# Patient Record
Sex: Female | Born: 1990 | Race: Black or African American | Hispanic: No | Marital: Single | Smoking: Never smoker
Health system: Southern US, Community
[De-identification: ages and names within clinical notes are randomized; demographics above are authoritative.]

## PROBLEM LIST (undated history)

## (undated) DIAGNOSIS — J302 Other seasonal allergic rhinitis: Secondary | ICD-10-CM

---
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
## 2015-03-04 ENCOUNTER — Encounter: Payer: Self-pay | Admitting: Emergency Medicine

## 2015-03-04 ENCOUNTER — Ambulatory Visit
Admission: EM | Admit: 2015-03-04 | Discharge: 2015-03-04 | Disposition: A | Discharge status: Home / Self Care | Payer: 59 | Attending: Family Medicine | Admitting: Family Medicine

## 2015-03-04 DIAGNOSIS — J0101 Acute recurrent maxillary sinusitis: Secondary | ICD-10-CM | POA: Insufficient documentation

## 2015-03-04 DIAGNOSIS — R51 Headache: Secondary | ICD-10-CM | POA: Diagnosis present

## 2015-03-04 LAB — RAPID STREP SCREEN (MED CTR MEBANE ONLY): Streptococcus, Group A Screen (Direct): NEGATIVE

## 2015-03-04 MED ORDER — AMOXICILLIN-POT CLAVULANATE 875-125 MG PO TABS: 1.0000 | ORAL_TABLET | Freq: Two times a day (BID) | ORAL | Status: AC

## 2015-03-04 MED ORDER — FLUTICASONE PROPIONATE 50 MCG/ACT NA SUSP: 2.0000 | Freq: Two times a day (BID) | NASAL | Status: AC

## 2015-03-04 MED ORDER — ACETAMINOPHEN 325 MG PO TABS: 650.0000 mg | ORAL_TABLET | Freq: Once | ORAL | Status: DC

## 2015-03-04 MED ORDER — PREDNISONE 10 MG PO TABS: ORAL_TABLET | ORAL | Status: AC

## 2015-03-04 NOTE — ED Notes (Signed)
Pt reports sinus pressure and drainage that started Tuesday. Symptoms also include sore throat and fever

## 2015-03-04 NOTE — Discharge Instructions (Signed)

## 2015-03-04 NOTE — ED Provider Notes (Signed)
CSN: [id number]     Arrival date & time 03/04/15  [id number] History   None    Chief Complaint  Patient presents with  . Facial Pain   (Consider location/radiation/quality/duration/timing/severity/associated sxs/prior Treatment) HPI     [age] year old female presents complaining of possible sinusitis. She has right-sided maxillary and frontal sinus pressure, drainage, rhinorrhea, nasal congestion, sore throat, fever, chills, and general fatigue. This started 3 days ago and has gotten gradually worse. She has seasonal allergies, she occasionally takes Claritin for this. No recent travel or sick contacts. She has a history of recurrent sinusitis.  No past medical history on file. No past surgical history on file. No family history on file. History  Substance Use Topics  . Smoking status: Never Smoker   . Smokeless tobacco: Not on file  . Alcohol Use: Yes     Comment: occassional   OB History    No data available     Review of Systems  Constitutional: Positive for fever and chills.  HENT: Positive for congestion, rhinorrhea and sinus pressure. Negative for ear pain.   Respiratory: Positive for cough. Negative for shortness of breath.   Cardiovascular: Negative for chest pain.  Gastrointestinal: Negative for nausea, vomiting, abdominal pain and diarrhea.  All other systems reviewed and are negative.   Allergies  Review of patient's allergies indicates no known allergies.  Home Medications   Prior to Admission medications   Medication Sig Start Date End Date Taking? Authorizing Provider  norethindrone-ethinyl estradiol (MICROGESTIN,JUNEL,LOESTRIN) 1-20 MG-MCG tablet Take 1 tablet by mouth daily.   Yes Historical Provider, MD  amoxicillin-clavulanate (AUGMENTIN) 875-125 MG per tablet Take 1 tablet by mouth every 12 (twelve) hours. 03/04/15   Adrian BlackwaterZachary H Liv Rallis, PA-C  fluticasone (FLONASE) 50 MCG/ACT nasal spray Place 2 sprays into both nostrils 2 (two) times daily. Decrease to 2 sprays/nostril  daily after 5 days 03/04/15   Graylon GoodZachary H Jacalynn Buzzell, PA-C  predniSONE (DELTASONE) 10 MG tablet 4 tabs PO QD for 4 days; 3 tabs PO QD for 3 days; 2 tabs PO QD for 2 days; 1 tab PO QD for 1 day 03/04/15   Graylon GoodZachary H Brooksie Ellwanger, PA-C   BP 122/80 mmHg  Pulse 102  Temp(Src) 100.2 F (37.9 C) (Oral)  Resp 14  Ht 5' 2.5" (1.588 m)  Wt 138 lb (62.596 kg)  BMI 24.82 kg/m2  SpO2 100% Physical Exam  Constitutional: She is oriented to person, place, and time. Vital signs are normal. She appears well-developed and well-nourished. No distress.  HENT:  Head: Normocephalic and atraumatic.  Right Ear: External ear normal.  Left Ear: External ear normal.  Nose: Right sinus exhibits maxillary sinus tenderness and frontal sinus tenderness. Left sinus exhibits frontal sinus tenderness. Left sinus exhibits no maxillary sinus tenderness.  Mouth/Throat: Posterior oropharyngeal erythema present. No oropharyngeal exudate.  Cobblestoning of the posterior oropharynx  Eyes: Conjunctivae are normal.  Neck: Normal range of motion. Neck supple.  Cardiovascular: Normal rate, regular rhythm and normal heart sounds.   Pulmonary/Chest: Effort normal and breath sounds normal. No respiratory distress.  Lymphadenopathy:    She has no cervical adenopathy.  Neurological: She is alert and oriented to person, place, and time. She has normal strength. Coordination normal.  Skin: Skin is warm and dry. No rash noted. She is not diaphoretic.  Psychiatric: She has a normal mood and affect. Judgment normal.  Nursing note and vitals reviewed.   ED Course  Procedures (including critical care time) Labs Review Labs Reviewed  RAPID STREP SCREEN (  NOT AT [facility])    Imaging Review No results found.   MDM   1. Acute recurrent maxillary sinusitis   treat with Augmentin as well as symptomatic management. Follow-up if no improvement in a few days  New Prescriptions   AMOXICILLIN-CLAVULANATE (AUGMENTIN) 875-125 MG PER TABLET    Take 1 tablet  by mouth every 12 (twelve) hours.   FLUTICASONE (FLONASE) 50 MCG/ACT NASAL SPRAY    Place 2 sprays into both nostrils 2 (two) times daily. Decrease to 2 sprays/nostril daily after 5 days   PREDNISONE (DELTASONE) 10 MG TABLET    4 tabs PO QD for 4 days; 3 tabs PO QD for 3 days; 2 tabs PO QD for 2 days; 1 tab PO QD for 1 day       Graylon Good, PA-C 03/04/15 [phone]

## 2015-03-07 LAB — CULTURE, GROUP A STREP (THRC)

## 2015-04-20 ENCOUNTER — Ambulatory Visit
Admission: EM | Admit: 2015-04-20 | Discharge: 2015-04-20 | Disposition: A | Discharge status: Home / Self Care | Payer: 59 | Attending: Family Medicine | Admitting: Family Medicine

## 2015-04-20 DIAGNOSIS — L03317 Cellulitis of buttock: Secondary | ICD-10-CM

## 2015-04-20 HISTORY — DX: Other seasonal allergic rhinitis: J30.2

## 2015-04-20 MED ORDER — SULFAMETHOXAZOLE-TRIMETHOPRIM 800-160 MG PO TABS: 1.0000 | ORAL_TABLET | Freq: Two times a day (BID) | ORAL | Status: AC

## 2015-04-20 NOTE — ED Provider Notes (Signed)
CSN: [id number]     Arrival date & time 04/20/15  1739 History   First MD Initiated Contact with Patient 04/20/15 1824     Chief Complaint  Patient presents with  . Skin Problem   (Consider location/radiation/quality/duration/timing/severity/associated sxs/prior Treatment) HPI Comments: 24 yo female with a 4-5 days h/o right buttock skin lesion, redness and pain. States 5 days ago felt a sudden sharp pain to the area but did not know what it was. Thinks may have been bitten by an insect. Denies any drainage, fevers, chills.   The history is provided by the patient.    Past Medical History  Diagnosis Date  . Seasonal allergies    Past Surgical History  Procedure Laterality Date  . Tonsillectomy  2007   No family history on file. History  Substance Use Topics  . Smoking status: Never Smoker   . Smokeless tobacco: Never Used  . Alcohol Use: Yes     Comment: occassional   OB History    Gravida Para Term Preterm AB TAB SAB Ectopic Multiple Living   0 0 0 0 0 0 0 0 0 0      Review of Systems  Allergies  Review of patient's allergies indicates no known allergies.  Home Medications   Prior to Admission medications   Medication Sig Start Date End Date Taking? Authorizing Provider  loratadine-pseudoephedrine (CLARITIN-D 24-HOUR) 10-240 MG per 24 hr tablet Take 1 tablet by mouth daily.   Yes Historical Provider, MD  amoxicillin-clavulanate (AUGMENTIN) 875-125 MG per tablet Take 1 tablet by mouth every 12 (twelve) hours. 03/04/15   Adrian BlackwaterZachary H Baker, PA-C  fluticasone (FLONASE) 50 MCG/ACT nasal spray Place 2 sprays into both nostrils 2 (two) times daily. Decrease to 2 sprays/nostril daily after 5 days 03/04/15   Graylon GoodZachary H Baker, PA-C  norethindrone-ethinyl estradiol (MICROGESTIN,JUNEL,LOESTRIN) 1-20 MG-MCG tablet Take 1 tablet by mouth daily.    Historical Provider, MD  predniSONE (DELTASONE) 10 MG tablet 4 tabs PO QD for 4 days; 3 tabs PO QD for 3 days; 2 tabs PO QD for 2 days; 1 tab  PO QD for 1 day 03/04/15   Graylon GoodZachary H Baker, PA-C  sulfamethoxazole-trimethoprim (BACTRIM DS,SEPTRA DS) 800-160 MG per tablet Take 1 tablet by mouth 2 (two) times daily. 04/20/15   Payton Mccallumrlando Shandell Jallow, MD   BP 122/87 mmHg  Pulse 72  Temp(Src) 99.1 F (37.3 C) (Oral)  Resp 16  Ht 5\' 2"  (1.575 m)  Wt 137 lb (62.143 kg)  BMI 25.05 kg/m2  SpO2 100%  LMP 04/10/2015 (Exact Date) Physical Exam  Constitutional: She appears well-developed and well-nourished. No distress.  Neurological: She is alert.  Skin: She is not diaphoretic. There is erythema.  Area approx 4x5cm of skin blanchable erythema, warmth and tenderness to palpation with pinpoint ulceration (puncture?) on the right buttock; no fluctuance or drainage  Nursing note and vitals reviewed.   ED Course  Procedures (including critical care time) Labs Review Labs Reviewed - No data to display  Imaging Review No results found.   MDM   1. Cellulitis of buttock, right    New Prescriptions   SULFAMETHOXAZOLE-TRIMETHOPRIM (BACTRIM DS,SEPTRA DS) 800-160 MG PER TABLET    Take 1 tablet by mouth 2 (two) times daily.  Plan: 1.  diagnosis reviewed with patient 2. rx as per orders; risks, benefits, potential side effects reviewed with patient 3. Recommend supportive treatment with warm compresses to area 4. F/u prn if symptoms worsen or don't improve    Payton Mccallumrlando Ranelle Auker, MD  04/20/15 1839 

## 2015-04-20 NOTE — ED Notes (Signed)
Possible bug bite x 5 days affecting the right buttock. Pt reports that there is a wound that is painful, red, swollen and warm to the touch.

## 2015-04-22 ENCOUNTER — Ambulatory Visit
Admission: EM | Admit: 2015-04-22 | Discharge: 2015-04-22 | Disposition: A | Discharge status: Home / Self Care | Payer: 59 | Attending: Internal Medicine | Admitting: Internal Medicine

## 2015-04-22 DIAGNOSIS — L0231 Cutaneous abscess of buttock: Secondary | ICD-10-CM | POA: Diagnosis not present

## 2015-04-22 NOTE — Discharge Instructions (Signed)
Finish trimethoprim/sulfamethoxazole. Continue applying warm compresses to abscess, soaking. Wash site with soap/water 1-2 times daily, apply antibiotic ointment and bandage after. Recheck in 3-4 days to check progress. Note for work given, RTW 04/25/15.

## 2015-04-22 NOTE — ED Notes (Signed)
Possible bug bite last Thursday around 9pm.  Was seen in this clinic on Tuesday, was told to put hot compress on it, now pus and blood coming out of the bite.  Location of the bite is right buttock.  Patient on antibiotics.

## 2015-04-22 NOTE — ED Provider Notes (Signed)
CSN: [id number]     Arrival date & time 04/22/15  1015 History   First MD Initiated Contact with Patient 04/22/15 1154     Chief Complaint  Patient presents with  . Insect Bite   HPI   24 year old seen 2 days ago with early abscess right buttock; she has been soaking and applying warm compresses, and spontaneous drainage from the site began yesterday. She is worried and fearful about possible incision and drainage; she is also concerned that the lesion hasn't healed yet. No fever, no malaise.  Past Medical History  Diagnosis Date  . Seasonal allergies    Past Surgical History  Procedure Laterality Date  . Tonsillectomy  2007   Family History  Problem Relation Age of Onset  . Hypertension Father    History  Substance Use Topics  . Smoking status: Never Smoker   . Smokeless tobacco: Never Used  . Alcohol Use: Yes     Comment: occassional   OB History    Gravida Para Term Preterm AB TAB SAB Ectopic Multiple Living   0 0 0 0 0 0 0 0 0 0      Review of Systems  All other systems reviewed and are negative.   Allergies  Review of patient's allergies indicates no known allergies.  Home Medications   Prior to Admission medications   Medication Sig Start Date End Date Taking? Authorizing Provider         loratadine-pseudoephedrine (CLARITIN-D 24-HOUR) 10-240 MG per 24 hr tablet Take 1 tablet by mouth daily.   Yes Historical Provider, MD  norethindrone-ethinyl estradiol (MICROGESTIN,JUNEL,LOESTRIN) 1-20 MG-MCG tablet Take 1 tablet by mouth daily.   Yes Historical Provider, MD  sulfamethoxazole-trimethoprim (BACTRIM DS,SEPTRA DS) 800-160 MG per tablet Take 1 tablet by mouth 2 (two) times daily. 04/20/15  Yes Payton Mccallumrlando Conty, MD                 BP 134/96 mmHg  Pulse 97  Temp(Src) 97 F (36.1 C) (Tympanic)  Resp 16  Ht 5\' 2"  (1.575 m)  Wt 137 lb (62.143 kg)  BMI 25.05 kg/m2  SpO2 100%  LMP 04/10/2015 Physical Exam  Constitutional: She is oriented to person, place, and  time.  Alert, nicely groomed Crying, very afraid about possible I&D  HENT:  Head: Atraumatic.  Eyes:  Conjugate gaze, no eye redness/drainage  Neck: Neck supple.  Cardiovascular: Normal rate.   Pulmonary/Chest: No respiratory distress.  Abdominal: She exhibits no distension.  Musculoskeletal: Normal range of motion.  No leg swelling  Neurological: She is alert and oriented to person, place, and time.  Skin: Skin is warm and dry.  No cyanosis Right buttock with a 1.5 inch area of induration/erythema, central opening, easily expressible purulent drainage, tender.  Nursing note and vitals reviewed.   ED Course  Procedures no procedure at the urgent care today  MDM   1. Abscess of buttock, right    Continue soaking, warm compresses. Finish antibiotic. Wash wound daily soap/water, apply antibiotic ointment and dry dressing. Recheck in 3 or 4 days if not improving.    Eustace MooreLaura W Lorri Fukuhara, MD 04/22/15 2055

## 2015-10-30 ENCOUNTER — Ambulatory Visit
Admission: EM | Admit: 2015-10-30 | Discharge: 2015-10-30 | Disposition: A | Discharge status: Other Acute Inpatient Hospital | Payer: 59 | Attending: Family Medicine | Admitting: Family Medicine

## 2015-10-30 ENCOUNTER — Emergency Department: Payer: 59

## 2015-10-30 ENCOUNTER — Encounter: Payer: Self-pay | Admitting: Emergency Medicine

## 2015-10-30 ENCOUNTER — Emergency Department
Admission: EM | Admit: 2015-10-30 | Discharge: 2015-10-31 | Disposition: A | Discharge status: Home / Self Care | Payer: 59 | Attending: Emergency Medicine | Admitting: Emergency Medicine

## 2015-10-30 ENCOUNTER — Encounter: Payer: Self-pay | Admitting: Gynecology

## 2015-10-30 DIAGNOSIS — Z7951 Long term (current) use of inhaled steroids: Secondary | ICD-10-CM | POA: Insufficient documentation

## 2015-10-30 DIAGNOSIS — Z79899 Other long term (current) drug therapy: Secondary | ICD-10-CM | POA: Insufficient documentation

## 2015-10-30 DIAGNOSIS — Z3202 Encounter for pregnancy test, result negative: Secondary | ICD-10-CM | POA: Insufficient documentation

## 2015-10-30 DIAGNOSIS — R197 Diarrhea, unspecified: Secondary | ICD-10-CM | POA: Insufficient documentation

## 2015-10-30 DIAGNOSIS — R509 Fever, unspecified: Secondary | ICD-10-CM | POA: Diagnosis not present

## 2015-10-30 DIAGNOSIS — R1031 Right lower quadrant pain: Secondary | ICD-10-CM | POA: Diagnosis present

## 2015-10-30 DIAGNOSIS — Z792 Long term (current) use of antibiotics: Secondary | ICD-10-CM | POA: Insufficient documentation

## 2015-10-30 LAB — COMPREHENSIVE METABOLIC PANEL
ALK PHOS: 53 U/L (ref 38–126)
ALT: 21 U/L (ref 14–54)
AST: 33 U/L (ref 15–41)
Albumin: 4.3 g/dL (ref 3.5–5.0)
Anion gap: 8 (ref 5–15)
BUN: 8 mg/dL (ref 6–20)
CALCIUM: 9.5 mg/dL (ref 8.9–10.3)
CO2: 25 mmol/L (ref 22–32)
Chloride: 104 mmol/L (ref 101–111)
Creatinine, Ser: 0.82 mg/dL (ref 0.44–1.00)
Glucose, Bld: 108 mg/dL — ABNORMAL HIGH (ref 65–99)
Potassium: 3.8 mmol/L (ref 3.5–5.1)
Sodium: 137 mmol/L (ref 135–145)
Total Bilirubin: 1 mg/dL (ref 0.3–1.2)
Total Protein: 7.6 g/dL (ref 6.5–8.1)

## 2015-10-30 LAB — WET PREP, GENITAL
Clue Cells Wet Prep HPF POC: NONE SEEN
Sperm: NONE SEEN
Trich, Wet Prep: NONE SEEN
Yeast Wet Prep HPF POC: NONE SEEN

## 2015-10-30 LAB — URINALYSIS COMPLETE WITH MICROSCOPIC (ARMC ONLY)
BACTERIA UA: NONE SEEN
Bilirubin Urine: NEGATIVE
Glucose, UA: NEGATIVE mg/dL
Ketones, ur: NEGATIVE mg/dL
LEUKOCYTES UA: NEGATIVE
Nitrite: NEGATIVE
PROTEIN: NEGATIVE mg/dL
Specific Gravity, Urine: 1.017 (ref 1.005–1.030)
pH: 6 (ref 5.0–8.0)

## 2015-10-30 LAB — CBC WITH DIFFERENTIAL/PLATELET
Basophils Absolute: 0 10*3/uL (ref 0–0.1)
Basophils Relative: 0 %
EOS PCT: 0 %
Eosinophils Absolute: 0 10*3/uL (ref 0–0.7)
HCT: 40 % (ref 35.0–47.0)
HEMOGLOBIN: 13.2 g/dL (ref 12.0–16.0)
LYMPHS ABS: 0.5 10*3/uL — AB (ref 1.0–3.6)
Lymphocytes Relative: 3 %
MCH: 27.7 pg (ref 26.0–34.0)
MCHC: 33.1 g/dL (ref 32.0–36.0)
MCV: 83.7 fL (ref 80.0–100.0)
Monocytes Absolute: 0.6 10*3/uL (ref 0.2–0.9)
Monocytes Relative: 4 %
NEUTROS PCT: 93 %
Neutro Abs: 13.1 10*3/uL — ABNORMAL HIGH (ref 1.4–6.5)
Platelets: 172 10*3/uL (ref 150–440)
RBC: 4.78 MIL/uL (ref 3.80–5.20)
RDW: 14 % (ref 11.5–14.5)
WBC: 14.2 10*3/uL — AB (ref 3.6–11.0)

## 2015-10-30 LAB — POCT PREGNANCY, URINE: PREG TEST UR: NEGATIVE

## 2015-10-30 LAB — LIPASE, BLOOD: Lipase: 17 U/L (ref 11–51)

## 2015-10-30 MED ORDER — IOHEXOL 300 MG/ML  SOLN
100.0000 mL | Freq: Once | INTRAMUSCULAR | Status: AC | PRN
  Administered 2015-10-30: 100 mL via INTRAVENOUS

## 2015-10-30 MED ORDER — LORAZEPAM 0.5 MG PO TABS
0.5000 mg | ORAL_TABLET | Freq: Once | ORAL | Status: AC
  Administered 2015-10-30: 0.5 mg via ORAL
  Filled 2015-10-30: qty 1

## 2015-10-30 MED ORDER — IOHEXOL 240 MG/ML SOLN
25.0000 mL | Freq: Once | INTRAMUSCULAR | Status: AC | PRN
  Administered 2015-10-30: 25 mL via ORAL

## 2015-10-30 NOTE — ED Notes (Signed)
Patient transported to Ultrasound 

## 2015-10-30 NOTE — ED Provider Notes (Signed)
CSN: [id number]     Arrival date & time 10/30/15  1422 History   First MD Initiated Contact with Patient 10/30/15 1451     Chief Complaint  Patient presents with  . Abdominal Pain   (Consider location/radiation/quality/duration/timing/severity/associated sxs/prior Treatment) HPI: Patient presents today with symptoms of generalized abdominal pain and diarrhea. Patient has a low-grade fever in the office today. She has noticed a pink color to her diarrhea however she has been eating grapefruit so she is unsure whether that has caused the discoloration. She normally has symptoms of constipation and has used mag citrate, fiber, grapefruit in the past for this. She denies any history of inflammatory bowel disease or diverticulitis. She still has her appendix. She denies having any vomiting.  Past Medical History  Diagnosis Date  . Seasonal allergies    Past Surgical History  Procedure Laterality Date  . Tonsillectomy  2007   Family History  Problem Relation Age of Onset  . Hypertension Father    Social History  Substance Use Topics  . Smoking status: Never Smoker   . Smokeless tobacco: Never Used  . Alcohol Use: Yes     Comment: occassional   OB History    Gravida Para Term Preterm AB TAB SAB Ectopic Multiple Living       Review of Systems: Negative except mentioned above.  Allergies  Review of patient's allergies indicates no known allergies.  Home Medications   Prior to Admission medications   Medication Sig Start Date End Date Taking? Authorizing Provider  MELATONIN ER PO Take by mouth.   Yes Historical Provider, MD  norethindrone-ethinyl estradiol (MICROGESTIN,JUNEL,LOESTRIN) 1-20 MG-MCG tablet Take 1 tablet by mouth daily.   Yes Historical Provider, MD  amoxicillin-clavulanate (AUGMENTIN) 875-125 MG per tablet Take 1 tablet by mouth every 12 (twelve) hours. 03/04/15   Adrian Blackwater Baker, PA-C  fluticasone (FLONASE) 50 MCG/ACT nasal spray Place 2 sprays  into both nostrils 2 (two) times daily. Decrease to 2 sprays/nostril daily after 5 days 03/04/15   Graylon Good, PA-C  loratadine-pseudoephedrine (CLARITIN-D 24-HOUR) 10-240 MG per 24 hr tablet Take 1 tablet by mouth daily.    Historical Provider, MD  predniSONE (DELTASONE) 10 MG tablet 4 tabs PO QD for 4 days; 3 tabs PO QD for 3 days; 2 tabs PO QD for 2 days; 1 tab PO QD for 1 day 03/04/15   Graylon Good, PA-C  sulfamethoxazole-trimethoprim (BACTRIM DS,SEPTRA DS) 800-160 MG per tablet Take 1 tablet by mouth 2 (two) times daily. 04/20/15   Payton Mccallum, MD   Meds Ordered and Administered this Visit  Medications - No data to display  BP 118/58 mmHg  Pulse 104  Temp(Src) 99.3 F (37.4 C) (Oral)  Resp 16  Ht  (1.575 m)  Wt 136 lb (61.689 kg)  BMI 24.87 kg/m2  SpO2 100%  LMP 10/26/2015 No data found.   Physical Exam:  GENERAL: NAD, lying down on exam table, not toxic appearing HEENT: no pharyngeal erythema, no exudate RESP: CTA B CARD: RRR ABD: +BS, no focal area of tenderness, no RLQ tenderness, no rebound or guarding, no flank tenderness  NEURO: CN II-XII grossly intact   ED Course  Procedures (including critical care time)    MDM  Abdominal Discomfort, Diarrhea- given patient's symptoms I would recommend that patient have a CT to further evaluate. She will have lab work done at the ER and will likely have a CT  given her symptoms. Patient will go there now with her sister. I will call ER charge nurse to inform the patient is coming.   Jolene Provost, MD 10/30/15 1536

## 2015-10-30 NOTE — ED Notes (Signed)
Patient c/o abdominal pain x this am . Patient stated hx of constipation / usually take magnesium citrate / fibers and grapefruit. Patient stated cramps felt like when she is constipated, however this time it is diarrhea with light pink color and grapefruit pulps that she had eaten.

## 2015-10-30 NOTE — ED Notes (Signed)
Lower abd pain since this am, no dysuria, no vomiting.

## 2015-10-30 NOTE — ED Provider Notes (Signed)
[facility] Emergency Department Provider Note  Time seen: 8:29 PM  I have reviewed the triage vital signs and the nursing notes.   HISTORY  Chief Complaint Abdominal Pain    HPI Nikhita Mentzel is a 25 y.o. female with no past medical history presents the emergency department for right-sided abdominal pain. According to the patient around 9 AM this morning she developed right-sided abdominal pain as well as diarrhea. Denies nausea or vomiting. States her abdominal pain is mild to moderate, always present but occasionally worse especially if he tries to sit up. Denies fever. Describes the pain currently is mild located mostly in the right mid to lower abdomen.Patient went to urgent care for evaluation was sent to the emergency department to rule out appendicitis.     Past Medical History  Diagnosis Date  . Seasonal allergies     There are no active problems to display for this patient.   Past Surgical History  Procedure Laterality Date  . Tonsillectomy  2007    Current Outpatient Rx  Name  Route  Sig  Dispense  Refill  . amoxicillin-clavulanate (AUGMENTIN) 875-125 MG per tablet   Oral   Take 1 tablet by mouth every 12 (twelve) hours.   14 tablet   0   . fluticasone (FLONASE) 50 MCG/ACT nasal spray   Each Nare   Place 2 sprays into both nostrils 2 (two) times daily. Decrease to 2 sprays/nostril daily after 5 days   16 g   12   . loratadine-pseudoephedrine (CLARITIN-D 24-HOUR) 10-240 MG per 24 hr tablet   Oral   Take 1 tablet by mouth daily.         Marland Kitchen MELATONIN ER PO   Oral   Take by mouth.         . norethindrone-ethinyl estradiol (MICROGESTIN,JUNEL,LOESTRIN) 1-20 MG-MCG tablet   Oral   Take 1 tablet by mouth daily.         . predniSONE (DELTASONE) 10 MG tablet      4 tabs PO QD for 4 days; 3 tabs PO QD for 3 days; 2 tabs PO QD for 2 days; 1 tab PO QD for 1 day   30 tablet   0   . sulfamethoxazole-trimethoprim (BACTRIM DS,SEPTRA  DS) 800-160 MG per tablet   Oral   Take 1 tablet by mouth 2 (two) times daily.   20 tablet   0     Allergies Review of patient's allergies indicates no known allergies.  Family History  Problem Relation Age of Onset  . Hypertension Father     Social History Social History  Substance Use Topics  . Smoking status: Never Smoker   . Smokeless tobacco: Never Used  . Alcohol Use: Yes     Comment: occassional    Review of Systems Constitutional: Negative for fever. Cardiovascular: Negative for chest pain. Respiratory: Negative for shortness of breath. Gastrointestinal: Mild to moderate right-sided abdominal pain. Negative for nausea/vomiting. Positive for diarrhea. Genitourinary: Negative for dysuria. Denies vaginal symptoms. Neurological: Negative for headache 10-point ROS otherwise negative.  ____________________________________________   PHYSICAL EXAM:  VITAL SIGNS: ED Triage Vitals  Enc Vitals Group     BP 10/30/15 1603 147/91 mmHg     Pulse Rate 10/30/15 1603 137     Resp 10/30/15 1603 20     Temp 10/30/15 1603 99.7 F (37.6 C)     Temp Source 10/30/15 1603 Oral     SpO2 10/30/15 1603 100 %  Weight 10/30/15 1603 136 lb (61.689 kg)     Height 10/30/15 1603  (1.575 m)     Head Cir --      Peak Flow --      Pain Score 10/30/15 1604 6     Pain Loc --      Pain Edu? --      Excl. in GC? --     Constitutional: Alert and oriented. Well appearing and in no distress. Eyes: Normal exam ENT   Head: Normocephalic and atraumatic   Mouth/Throat: Mucous membranes are moist. Cardiovascular: Normal rate, regular rhythm. No murmur Respiratory: Normal respiratory effort without tachypnea nor retractions. Breath sounds are clear and equal bilaterally. No wheezes/rales/rhonchi. Gastrointestinal: Soft, mild right mid to right lower quadrant tenderness palpation. No rebound or guarding. No distention. Musculoskeletal: Nontender with normal range of motion in  all extremities.  Neurologic:  Normal speech and language. No gross focal neurologic deficits Skin:  Skin is warm, dry and intact.  Psychiatric: Mood and affect are normal. Speech and behavior are normal.  ____________________________________________   RADIOLOGY  CT shows haziness around the pelvis area. Recommend pelvic ultrasound.  ____________________________________________    INITIAL IMPRESSION / ASSESSMENT AND PLAN / ED COURSE  Pertinent labs & imaging results that were available during my care of the patient were reviewed by me and considered in my medical decision making (see chart for details).  Patient presents to the emergency department right-sided abdominal pain beginning in an a.m. this morning. Seen in urgent care sent to the emergency department for appendicitis rule out. Patient has an elevated white blood cell count otherwise labs within normal limits. Mild tenderness palpation in the right mid to right lower quadrant. We'll proceed with a CT abdomen/pelvis to further evaluate. Patient agreeable to plan.  CT shows haziness around the pelvic area, recommend pelvic ultrasound. Pelvic exam shows minimal cervical discharge, scant amount of blood within the vaginal vault, patient states she just finished her period Yesterday. No cervical motion tenderness or adnexal tenderness on exam. We'll proceed with an ultrasound to further evaluate. Patient states she is feeling somewhat better at this time.  Ultrasound negative  ____________________________________________   FINAL CLINICAL IMPRESSION(S) / ED DIAGNOSES  Right-sided abdominal pain   Minna Antis, MD 11/05/15 1556

## 2015-10-30 NOTE — ED Notes (Signed)
Patient shaking, crying, very afraid of having blood drawn and states does not want it done. Unalbe to void at this time. Will attempt urine when able. Blood work not done at this time.

## 2015-10-31 LAB — CHLAMYDIA/NGC RT PCR (ARMC ONLY)
Chlamydia Tr: NOT DETECTED
N GONORRHOEAE: NOT DETECTED

## 2015-10-31 MED ORDER — IBUPROFEN 600 MG PO TABS: 600.0000 mg | ORAL_TABLET | Freq: Three times a day (TID) | ORAL | Status: AC | PRN

## 2015-10-31 MED ORDER — HYDROCODONE-ACETAMINOPHEN 5-325 MG PO TABS: 1.0000 | ORAL_TABLET | Freq: Four times a day (QID) | ORAL | Status: AC | PRN

## 2015-10-31 NOTE — ED Notes (Signed)
MD at bedside. 

## 2015-10-31 NOTE — ED Provider Notes (Signed)
-----------------------------------------   12:26 AM on 10/31/2015 -----------------------------------------  Pelvic ultrasound interpreted per Dr. Cherly Hensen: Unremarkable pelvic ultrasound. No evidence for ovarian torsion.  Updated patient of wet prep, DNA and pelvic ultrasound results. She is feeling much better. Strict return precautions given. Patient verbalizes understanding and agrees with plan of care.  Irean Hong, MD 10/31/15 [phone]

## 2015-10-31 NOTE — Discharge Instructions (Signed)
1. You may take pain medicines as needed (Motrin/Norco #15). 2. Return to the ER for worsening symptoms, persistent vomiting, difficulty breathing or other concerns.  Abdominal Pain, Adult Many things can cause abdominal pain. Usually, abdominal pain is not caused by a disease and will improve without treatment. It can often be observed and treated at home. Your health care provider will do a physical exam and possibly order blood tests and X-rays to help determine the seriousness of your pain. However, in many cases, more time must pass before a clear cause of the pain can be found. Before that point, your health care provider may not know if you need more testing or further treatment. HOME CARE INSTRUCTIONS Monitor your abdominal pain for any changes. The following actions may help to alleviate any discomfort you are experiencing:  Only take over-the-counter or prescription medicines as directed by your health care provider.  Do not take laxatives unless directed to do so by your health care provider.  Try a clear liquid diet (broth, tea, or water) as directed by your health care provider. Slowly move to a bland diet as tolerated. SEEK MEDICAL CARE IF:  You have unexplained abdominal pain.  You have abdominal pain associated with nausea or diarrhea.  You have pain when you urinate or have a bowel movement.  You experience abdominal pain that wakes you in the night.  You have abdominal pain that is worsened or improved by eating food.  You have abdominal pain that is worsened with eating fatty foods.  You have a fever. SEEK IMMEDIATE MEDICAL CARE IF:  Your pain does not go away within 2 hours.  You keep throwing up (vomiting).  Your pain is felt only in portions of the abdomen, such as the right side or the left lower portion of the abdomen.  You pass bloody or black tarry stools. MAKE SURE YOU:  Understand these instructions.  Will watch your condition.  Will get help  right away if you are not doing well or get worse.   This information is not intended to replace advice given to you by your health care provider. Make sure you discuss any questions you have with your health care provider.   Document Released: 07/05/2005 Document Revised: 06/16/2015 Document Reviewed: 06/04/2013 Elsevier Interactive Patient Education [employer]

## 2015-11-22 ENCOUNTER — Encounter: Payer: Self-pay | Admitting: Emergency Medicine

## 2015-11-22 ENCOUNTER — Ambulatory Visit
Admission: EM | Admit: 2015-11-22 | Discharge: 2015-11-22 | Disposition: A | Discharge status: Home / Self Care | Payer: 59 | Attending: Family Medicine | Admitting: Family Medicine

## 2015-11-22 DIAGNOSIS — L739 Follicular disorder, unspecified: Secondary | ICD-10-CM

## 2015-11-22 DIAGNOSIS — M26622 Arthralgia of left temporomandibular joint: Secondary | ICD-10-CM

## 2015-11-22 DIAGNOSIS — M26629 Arthralgia of temporomandibular joint, unspecified side: Secondary | ICD-10-CM | POA: Diagnosis not present

## 2015-11-22 MED ORDER — MUPIROCIN 2 % EX OINT: 1.0000 "application " | TOPICAL_OINTMENT | Freq: Three times a day (TID) | CUTANEOUS | Status: AC

## 2015-11-22 MED ORDER — MELOXICAM 15 MG PO TABS: 15.0000 mg | ORAL_TABLET | Freq: Every day | ORAL | Status: AC

## 2015-11-22 NOTE — ED Notes (Signed)
Patient c/o swelling in front of her left ear and swollen bumps in her hairline and pain in her teeth since yesterday.  Patient denies fevers.

## 2015-11-22 NOTE — Discharge Instructions (Signed)
Folliculitis Folliculitis is redness, soreness, and swelling (inflammation) of the hair follicles. This condition can occur anywhere on the body. People with weakened immune systems, diabetes, or obesity have a greater risk of getting folliculitis. CAUSES  Bacterial infection. This is the most common cause.  Fungal infection.  Viral infection.  Contact with certain chemicals, especially oils and tars. Long-term folliculitis can result from bacteria that live in the nostrils. The bacteria may trigger multiple outbreaks of folliculitis over time. SYMPTOMS Folliculitis most commonly occurs on the scalp, thighs, legs, back, buttocks, and areas where hair is shaved frequently. An early sign of folliculitis is a [place], white or yellow, pus-filled, itchy lesion (pustule). These lesions appear on a red, inflamed follicle. They are usually less than 0.2 inches (5 mm) wide. When there is an infection of the follicle that goes deeper, it becomes a boil or furuncle. A group of closely packed boils creates a larger lesion (carbuncle). Carbuncles tend to occur in hairy, sweaty areas of the body. DIAGNOSIS  Your caregiver can usually tell what is wrong by doing a physical exam. A sample may be taken from one of the lesions and tested in a lab. This can help determine what is causing your folliculitis. TREATMENT  Treatment may include:  Applying warm compresses to the affected areas.  Taking antibiotic medicines orally or applying them to the skin.  Draining the lesions if they contain a large amount of pus or fluid.  Laser hair removal for cases of long-lasting folliculitis. This helps to prevent regrowth of the hair. HOME CARE INSTRUCTIONS  Apply warm compresses to the affected areas as directed by your caregiver.  If antibiotics are prescribed, take them as directed. Finish them even if you start to feel better.  You may take over-the-counter medicines to relieve itching.  Do not shave  irritated skin.  Follow up with your caregiver as directed. SEEK IMMEDIATE MEDICAL CARE IF:   You have increasing redness, swelling, or pain in the affected area.  You have a fever. MAKE SURE YOU:  Understand these instructions.  Will watch your condition.  Will get help right away if you are not doing well or get worse.   This information is not intended to replace advice given to you by your health care provider. Make sure you discuss any questions you have with your health care provider.   Document Released: 12/04/2001 Document Revised: 10/16/2014 Document Reviewed: 12/26/2011 Elsevier Interactive Patient Education 2016 [employer] Temporomandibular Joint Syndrome Temporomandibular joint (TMJ) syndrome is a condition that affects the joints between your jaw and your skull. The TMJs are located near your ears and allow your jaw to open and close. These joints and the nearby muscles are involved in all movements of the jaw. People with TMJ syndrome have pain in the area of these joints and muscles. Chewing, biting, or other movements of the jaw can be difficult or painful. TMJ syndrome can be caused by various things. In many cases, the condition is mild and goes away within a few weeks. For some people, the condition can become a long-term problem. CAUSES Possible causes of TMJ syndrome include:  Grinding your teeth or clenching your jaw. Some people do this when they are under stress.  Arthritis.  Injury to the jaw.  Head or neck injury.  Teeth or dentures that are not aligned well. In some cases, the cause of TMJ syndrome may not be known. SIGNS AND SYMPTOMS The most common symptom is an aching pain on  the side of the head in the area of the TMJ. Other symptoms may include:  Pain when moving your jaw, such as when chewing or biting.  Being unable to open your jaw all the way.  Making a clicking sound when you open your mouth.  Headache.  Earache.  Neck or  shoulder pain. DIAGNOSIS Diagnosis can usually be made based on your symptoms, your medical history, and a physical exam. Your health care provider may check the range of motion of your jaw. Imaging tests, such as X-rays or an MRI, are sometimes done. You may need to see your dentist to determine if your teeth and jaw are lined up correctly. TREATMENT TMJ syndrome often goes away on its own. If treatment is needed, the options may include:  Eating soft foods and applying ice or heat.  Medicines to relieve pain or inflammation.  Medicines to relax the muscles.  A splint, bite plate, or mouthpiece to prevent teeth grinding or jaw clenching.  Relaxation techniques or counseling to help reduce stress.  Transcutaneous electrical nerve stimulation (TENS). This helps to relieve pain by applying an electrical current through the skin.  Acupuncture. This is sometimes helpful to relieve pain.  Jaw surgery. This is rarely needed. HOME CARE INSTRUCTIONS  Take medicines only as directed by your health care provider.  Eat a soft diet if you are having trouble chewing.  Apply ice to the painful area.  Put ice in a plastic bag.  Place a towel between your skin and the bag.  Leave the ice on for 20 minutes, 2-3 times a day.  Apply a warm compress to the painful area as directed.  Massage your jaw area and perform any jaw stretching exercises as recommended by your health care provider.  If you were given a mouthpiece or bite plate, wear it as directed.  Avoid foods that require a lot of chewing. Do not chew gum.  Keep all follow-up visits as directed by your health care provider. This is important. SEEK MEDICAL CARE IF:  You are having trouble eating.  You have new or worsening symptoms. SEEK IMMEDIATE MEDICAL CARE IF:  Your jaw locks open or closed.   This information is not intended to replace advice given to you by your health care provider. Make sure you discuss any questions  you have with your health care provider.   Document Released: 06/20/2001 Document Revised: 10/16/2014 Document Reviewed: 04/30/2014 Elsevier Interactive Patient Education [employer]

## 2015-11-22 NOTE — ED Provider Notes (Signed)
CSN: [id number]     Arrival date & time 11/22/15  0906 History   First MD Initiated Contact with Patient 11/22/15 [phone]   Nurses notes were reviewed. Chief Complaint  Patient presents with  . Facial Swelling   Patient was seen because of pain over her left temporal area. She states that Saturday or Sunday she took a break down and notices swelling over the left side of her forehead/temple area. She was concerned with Katrinka Blazing had a mosquito bite in the area since it was so tender. She also knows tenderness under the left ear. She does admit been a grinder at night so shegot a mouth piece yesterday but after one night the mouth pain and jaw pain did not improve so she came in to be seen and evaluated.. She states that she wanted make sure that she head this off before she winds up in the hospital.   Past medical history she was seen here and subsequently in the ED CT of the abdomen transvaginal ultrasound and ultrasound to be diagnosed with a ruptured ovarian cyst. Since then abdominal pain is no longer a problem. She has seasonal allergies she's allergic to hay but she does not smoke. Family medical history is positive for hypertension in her father   (Consider location/radiation/quality/duration/timing/severity/associated sxs/prior Treatment) Patient is a 25 y.o. female presenting with tooth pain. The history is provided by the patient and a significant other. No language interpreter was used.  Dental Pain Location:  Upper Quality:  Pressure-like Severity:  Moderate Onset quality:  Sudden Duration:  2 days Progression:  Unable to specify Relieved by:  Nothing Ineffective treatments:  NSAIDs Associated symptoms: facial pain, facial swelling and fever     Past Medical History  Diagnosis Date  . Seasonal allergies    Past Surgical History  Procedure Laterality Date  . Tonsillectomy  2007   Family History  Problem Relation Age of Onset  . Hypertension Father    Social History    Substance Use Topics  . Smoking status: Never Smoker   . Smokeless tobacco: Never Used  . Alcohol Use: Yes     Comment: occassional   OB History    Gravida Para Term Preterm AB TAB SAB Ectopic Multiple Living       Review of Systems  Constitutional: Positive for fever.  HENT: Positive for facial swelling.   All other systems reviewed and are negative.   Allergies  Review of patient's allergies indicates no known allergies.  Home Medications   Prior to Admission medications   Medication Sig Start Date End Date Taking? Authorizing Provider  amoxicillin-clavulanate (AUGMENTIN) 875-125 MG per tablet Take 1 tablet by mouth every 12 (twelve) hours. 03/04/15   Adrian Blackwater Baker, PA-C  fluticasone (FLONASE) 50 MCG/ACT nasal spray Place 2 sprays into both nostrils 2 (two) times daily. Decrease to 2 sprays/nostril daily after 5 days 03/04/15   Graylon Good, PA-C  HYDROcodone-acetaminophen (NORCO) 5-325 MG tablet Take 1 tablet by mouth every 6 (six) hours as needed for moderate pain. 10/31/15   Irean Hong, MD  ibuprofen (ADVIL,MOTRIN) 600 MG tablet Take 1 tablet (600 mg total) by mouth every 8 (eight) hours as needed. 10/31/15   Irean Hong, MD  loratadine-pseudoephedrine (CLARITIN-D 24-HOUR) 10-240 MG per 24 hr tablet Take 1 tablet by mouth daily.    Historical Provider, MD  MELATONIN ER PO Take by mouth.    Historical Provider, MD  meloxicam (MOBIC) 15 MG tablet Take 1 tablet (15 mg total) by mouth daily. Not take with Motrin or Aleve. 11/22/15   Hassan Rowan, MD  mupirocin ointment (BACTROBAN) 2 % Apply 1 application topically 3 (three) times daily. 11/22/15   Hassan Rowan, MD  norethindrone-ethinyl estradiol (MICROGESTIN,JUNEL,LOESTRIN) 1-20 MG-MCG tablet Take 1 tablet by mouth daily.    Historical Provider, MD  predniSONE (DELTASONE) 10 MG tablet 4 tabs PO QD for 4 days; 3 tabs PO QD for 3 days; 2 tabs PO QD for 2 days; 1 tab PO QD for 1 day 03/04/15   Graylon Good, PA-C   sulfamethoxazole-trimethoprim (BACTRIM DS,SEPTRA DS) 800-160 MG per tablet Take 1 tablet by mouth 2 (two) times daily. 04/20/15   Payton Mccallum, MD   Meds Ordered and Administered this Visit  Medications - No data to display  BP 125/87 mmHg  Pulse 82  Temp(Src) 98.4 F (36.9 C) (Oral)  Resp 16  Ht 5' 2.5" (1.588 m)  Wt 136 lb (61.689 kg)  BMI 24.46 kg/m2  SpO2 100%  LMP 10/26/2015 (Exact Date) No data found.   Physical Exam  Constitutional: She is oriented to person, place, and time. She appears well-developed and well-nourished.  HENT:  Head: Atraumatic. Macrocephalic.    Right Ear: Hearing, tympanic membrane, external ear and ear canal normal.  Left Ear: Hearing, tympanic membrane, external ear and ear canal normal.  Nose: No mucosal edema or rhinorrhea. Right sinus exhibits no maxillary sinus tenderness. Left sinus exhibits no maxillary sinus tenderness.  Mouth/Throat: No posterior oropharyngeal edema or posterior oropharyngeal erythema.  Eyes: Conjunctivae are normal. Pupils are equal, round, and reactive to light.  Neck: Normal range of motion. Neck supple.  Cardiovascular: Normal rate and regular rhythm.   Pulmonary/Chest: Effort normal.  Neurological: She is alert and oriented to person, place, and time.  Skin: Skin is warm and dry.  Psychiatric: She has a normal mood and affect.  Vitals reviewed.  Patient had two swollen areas over the L temporal area w/TMJ area tendernesss.    ED Course  Procedures (including critical care time)  Labs Review Labs Reviewed - No data to display  Imaging Review No results found.   Visual Acuity Review  Right Eye Distance:   Left Eye Distance:   Bilateral Distance:    Right Eye Near:   Left Eye Near:    Bilateral Near:         MDM   1. Arthralgia of left temporomandibular joint   2. Acute folliculitis   3. TMJ syndrome    Will treat patient for TMJ w/mobic and folliculitis w/bactroban. Patient decline work  note and school note. Follow-up PCP if not better in a week.  Note: This dictation was prepared with Dragon dictation along with smaller phrase technology. Any transcriptional errors that result from this process are unintentional..  Hassan Rowan, MD 11/22/15 1023

## 2015-11-28 ENCOUNTER — Ambulatory Visit
Admission: EM | Admit: 2015-11-28 | Discharge: 2015-11-28 | Disposition: A | Discharge status: Home / Self Care | Payer: 59 | Attending: Family Medicine | Admitting: Family Medicine

## 2015-11-28 DIAGNOSIS — L708 Other acne: Secondary | ICD-10-CM | POA: Diagnosis not present

## 2015-11-28 DIAGNOSIS — L739 Follicular disorder, unspecified: Secondary | ICD-10-CM

## 2015-11-28 MED ORDER — DOXYCYCLINE HYCLATE 100 MG PO CAPS: 100.0000 mg | ORAL_CAPSULE | Freq: Two times a day (BID) | ORAL | Status: AC

## 2015-11-28 MED ORDER — MOMETASONE FUROATE 0.1 % EX CREA: 1.0000 "application " | TOPICAL_CREAM | Freq: Every day | CUTANEOUS | Status: AC

## 2015-11-28 NOTE — ED Notes (Signed)
Facial irritation. Pt was seen on 11/22/2015 for arthralgia of TMJ. Was treated with Mobic and Bactroban. Pt reports the le ft side of her face itches and burns. Denies rash. Denies new use of lotions, soaps and shampoos prior to the onset of the irritation.

## 2015-11-28 NOTE — ED Provider Notes (Signed)
CSN: [id number]     Arrival date & time 11/28/15  1004 History   First MD Initiated Contact with Patient 11/28/15 1111    Nurses notes were reviewed. Chief Complaint  Patient presents with  . Skin Problem   Patient was treated for TMJ syndrome recently. That time she is complaining of some pre-to be Mczeal adenopathy on the left side of her forehead. We will place on Bactroban ointment which she states that seemed to Bactrim ointment made skin irritation worse. States she feels bumps on her left forehead. She reports the skin has been itching her a great deal.   (Consider location/radiation/quality/duration/timing/severity/associated sxs/prior Treatment) Patient is a 25 y.o. female presenting with rash. The history is provided by the patient. No language interpreter was used.  Rash Location:  Head/neck Head/neck rash location:  Scalp and head Quality: itchiness and redness   Severity:  Moderate Onset quality:  Unable to specify Progression:  Spreading Context: not animal contact, not chemical exposure, not hot tub use and not insect bite/sting   Relieved by:  Nothing   Past Medical History  Diagnosis Date  . Seasonal allergies    Past Surgical History  Procedure Laterality Date  . Tonsillectomy  2007   Family History  Problem Relation Age of Onset  . Hypertension Father    Social History  Substance Use Topics  . Smoking status: Never Smoker   . Smokeless tobacco: Never Used  . Alcohol Use: Yes     Comment: occassional   OB History    Gravida Para Term Preterm AB TAB SAB Ectopic Multiple Living       Review of Systems  Skin: Positive for rash.  All other systems reviewed and are negative.   Allergies  Review of patient's allergies indicates no known allergies.  Home Medications   Prior to Admission medications   Medication Sig Start Date End Date Taking? Authorizing Provider  fluticasone (FLONASE) 50 MCG/ACT nasal spray Place 2 sprays into  both nostrils 2 (two) times daily. Decrease to 2 sprays/nostril daily after 5 days 03/04/15  Yes Graylon Good, PA-C  HYDROcodone-acetaminophen (NORCO) 5-325 MG tablet Take 1 tablet by mouth every 6 (six) hours as needed for moderate pain. 10/31/15  Yes Irean Hong, MD  ibuprofen (ADVIL,MOTRIN) 600 MG tablet Take 1 tablet (600 mg total) by mouth every 8 (eight) hours as needed. 10/31/15  Yes Irean Hong, MD  loratadine-pseudoephedrine (CLARITIN-D 24-HOUR) 10-240 MG per 24 hr tablet Take 1 tablet by mouth daily.   Yes Historical Provider, MD  MELATONIN ER PO Take by mouth.   Yes Historical Provider, MD  meloxicam (MOBIC) 15 MG tablet Take 1 tablet (15 mg total) by mouth daily. Not take with Motrin or Aleve. 11/22/15  Yes Hassan Rowan, MD  mupirocin ointment (BACTROBAN) 2 % Apply 1 application topically 3 (three) times daily. 11/22/15  Yes Hassan Rowan, MD  norethindrone-ethinyl estradiol (MICROGESTIN,JUNEL,LOESTRIN) 1-20 MG-MCG tablet Take 1 tablet by mouth daily.   Yes Historical Provider, MD  amoxicillin-clavulanate (AUGMENTIN) 875-125 MG per tablet Take 1 tablet by mouth every 12 (twelve) hours. 03/04/15   Graylon Good, PA-C  doxycycline (VIBRAMYCIN) 100 MG capsule Take 1 capsule (100 mg total) by mouth 2 (two) times daily. 11/28/15   Hassan Rowan, MD  mometasone (ELOCON) 0.1 % cream Apply 1 application topically daily. 11/28/15   Hassan Rowan, MD  predniSONE (DELTASONE) 10 MG tablet 4 tabs PO QD  for 4 days; 3 tabs PO QD for 3 days; 2 tabs PO QD for 2 days; 1 tab PO QD for 1 day 03/04/15   Graylon Good, PA-C  sulfamethoxazole-trimethoprim (BACTRIM DS,SEPTRA DS) 800-160 MG per tablet Take 1 tablet by mouth 2 (two) times daily. 04/20/15   Payton Mccallum, MD   Meds Ordered and Administered this Visit  Medications - No data to display  BP 133/79 mmHg  Pulse 69  Temp(Src) 98.2 F (36.8 C) (Oral)  Resp 16  Ht 5' 2.5" (1.588 m)  Wt 136 lb (61.689 kg)  BMI 24.46 kg/m2  SpO2 100%  LMP 11/22/2015 No data  found.   Physical Exam  Constitutional: She is oriented to person, place, and time. She appears well-developed and well-nourished.  HENT:  Head: Normocephalic and atraumatic.  Eyes: Conjunctivae are normal. Pupils are equal, round, and reactive to light.  Neurological: She is alert and oriented to person, place, and time.  Skin: Skin is warm. Rash noted. Rash is vesicular. There is erythema.     Patient has multiple Delg[place]illo follicles over the left skin line for scalp is going to was the left eye and actually a few follicles on her left side of her nostrils as well.  Psychiatric: She has a normal mood and affect.  Vitals reviewed.   ED Course  Procedures (including critical care time)  Labs Review Labs Reviewed - No data to display  Imaging Review No results found.   Visual Acuity Review  Right Eye Distance:   Left Eye Distance:   Bilateral Distance:    Right Eye Near:   Left Eye Near:    Bilateral Near:         MDM   1. Folliculitis   2. Follicular acne    Explained patient that I think she has some folliculitis. Still concerned discuss cost her more on the left upper part of her face and scalp. We'll place her on doxycycline 100 mg twice a day and also place her on some Elocon cream once a day. Strongly suggested if this does not clear things up that she may need see a dermatologist of her choice. 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    Hassan Rowan, MD 11/28/15 1146

## 2015-11-28 NOTE — Discharge Instructions (Signed)
Folliculitis Folliculitis is redness, soreness, and swelling (inflammation) of the hair follicles. This condition can occur anywhere on the body. People with weakened immune systems, diabetes, or obesity have a greater risk of getting folliculitis. CAUSES  Bacterial infection. This is the most common cause.  Fungal infection.  Viral infection.  Contact with certain chemicals, especially oils and tars. Long-term folliculitis can result from bacteria that live in the nostrils. The bacteria may trigger multiple outbreaks of folliculitis over time. SYMPTOMS Folliculitis most commonly occurs on the scalp, thighs, legs, back, buttocks, and areas where hair is shaved frequently. An early sign of folliculitis is a Klindt, white or yellow, pus-filled, itchy lesion (pustule). These lesions appear on a red, inflamed follicle. They are usually less than 0.2 inches (5 mm) wide. When there is an infection of the follicle that goes deeper, it becomes a boil or furuncle. A group of closely packed boils creates a larger lesion (carbuncle). Carbuncles tend to occur in hairy, sweaty areas of the body. DIAGNOSIS  Your caregiver can usually tell what is wrong by doing a physical exam. A sample may be taken from one of the lesions and tested in a lab. This can help determine what is causing your folliculitis. TREATMENT  Treatment may include:  Applying warm compresses to the affected areas.  Taking antibiotic medicines orally or applying them to the skin.  Draining the lesions if they contain a large amount of pus or fluid.  Laser hair removal for cases of long-lasting folliculitis. This helps to prevent regrowth of the hair. HOME CARE INSTRUCTIONS  Apply warm compresses to the affected areas as directed by your caregiver.  If antibiotics are prescribed, take them as directed. Finish them even if you start to feel better.  You may take over-the-counter medicines to relieve itching.  Do not shave irritated  skin.  Follow up with your caregiver as directed. SEEK IMMEDIATE MEDICAL CARE IF:   You have increasing redness, swelling, or pain in the affected area.  You have a fever. MAKE SURE YOU:  Understand these instructions.  Will watch your condition.  Will get help right away if you are not doing well or get worse.   This information is not intended to replace advice given to you by your health care provider. Make sure you discuss any questions you have with your health care provider.   Document Released: 12/04/2001 Document Revised: 10/16/2014 Document Reviewed: 12/26/2011 Elsevier Interactive Patient Education 2016 [employer]  Acne Acne is a skin problem that causes [place], red bumps (pimples). Acne happens when the tiny holes in your skin (pores) get blocked. Your pores may become red, sore, and swollen. They may also become infected. Acne is a common skin problem. It is especially common in teenagers. Acne usually goes away over time. HOME CARE Good skin care is the most important thing you can do to treat your acne. Take care of your skin as told by your doctor. You may be told to do these things:  Wash your skin gently at least two times each day. You should also wash your skin:  After you exercise.  Before you go to bed.  Use mild soap.  Use a water-based skin moisturizer after you wash your skin.  Use a sunscreen or sunblock with SPF 30 or greater. This is very important if you are using acne medicines.  Choose cosmetics that will not plug your oil glands (are noncomedogenic). Medicines  Take over-the-counter and prescription medicines only as told by  your doctor.  If you were prescribed an antibiotic medicine, apply or take it as told by your doctor. Do not stop using the antibiotic even if your acne improves. General Instructions  Keep your hair clean and off of your face. Shampoo your hair regularly. If you have oily hair, you may need to wash it every  day.  Avoid leaning your chin or forehead on your hands.  Avoid wearing tight headbands or hats.  Avoid picking or squeezing your pimples. That can make your acne worse and cause scarring.  Keep all follow-up visits as told by your doctor. This is important.  Shave gently. Only shave when it is necessary.  Keep a food journal. This can help you to see if any foods are linked with your acne. GET HELP IF:  Your acne is not better after eight weeks.  Your acne gets worse.  You have a large area of skin that is red or tender.  You think that you are having side effects from any acne medicine.   This information is not intended to replace advice given to you by your health care provider. Make sure you discuss any questions you have with your health care provider.   Document Released: 09/14/2011 Document Revised: 06/16/2015 Document Reviewed: 12/02/2014 Elsevier Interactive Patient Education [employer]

## 2017-12-19 IMAGING — CT CT ABD-PELV W/ CM
1 of 2 series · 15 of 32 positions shown, 19 images · IV contrast (omnipaque)
Comparison: None.

CLINICAL DATA: 24-year-old female with generalized abdominal pain
and fever

EXAM:
CT ABDOMEN AND PELVIS WITH CONTRAST
TECHNIQUE: Multidetector CT imaging of the abdomen and pelvis was performed
using the standard protocol following bolus administration of
intravenous contrast.
CONTRAST:  100mL OMNIPAQUE IOHEXOL 300 MG/ML  SOLN

[Series 2: routine abd pel with · axial · 0.62mm/px · z∈[-454,-70]mm · 15 of 85 slices shown, 19 images]
[im 4/85  soft-tissue]
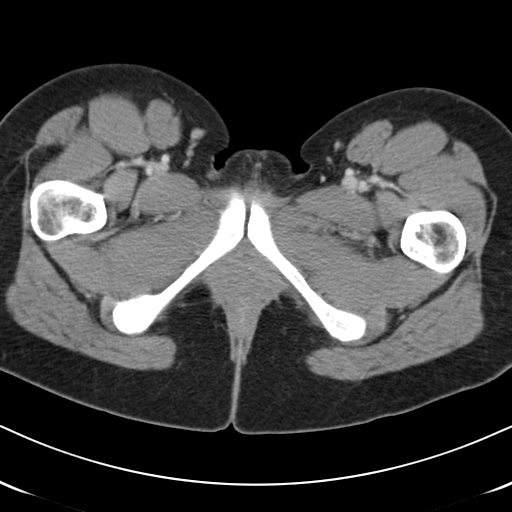
[im 4/85  bone]
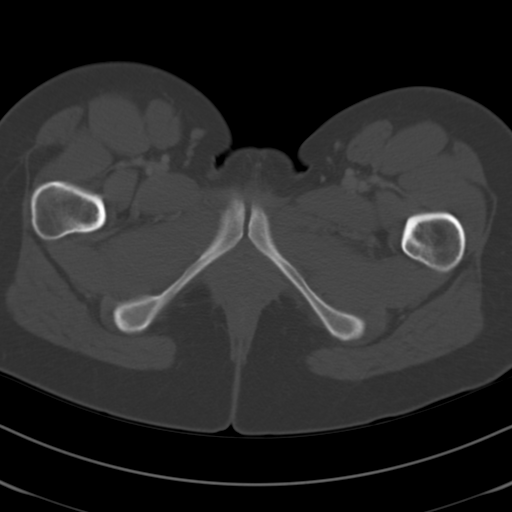
[im 11/85  soft-tissue]
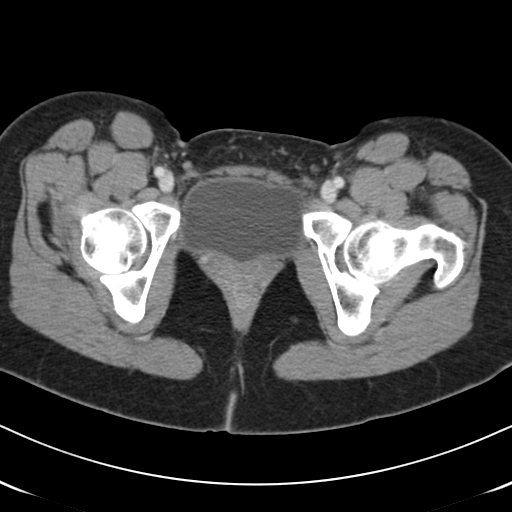
[im 18/85  soft-tissue]
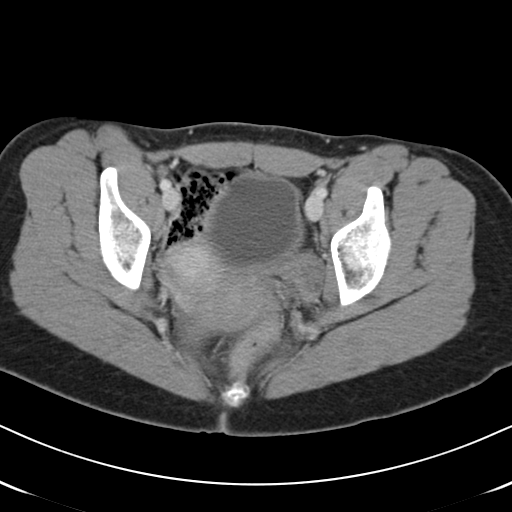
[im 25/85  soft-tissue]
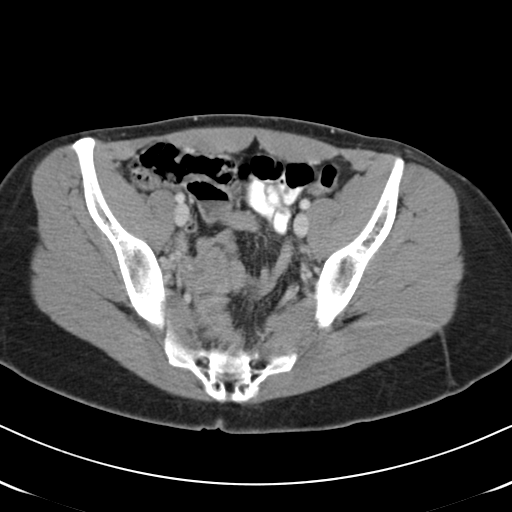
[im 29/85  soft-tissue]
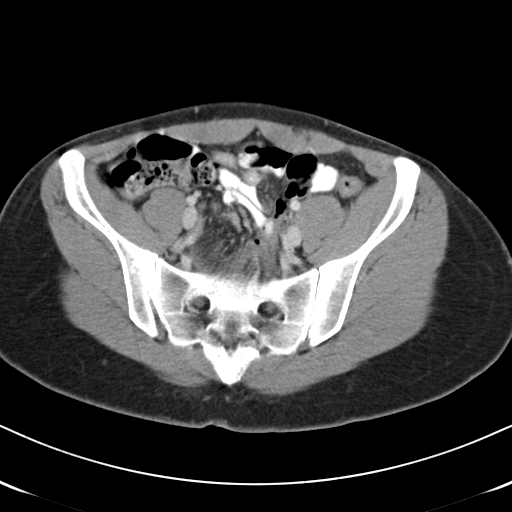
[im 36/85  soft-tissue]
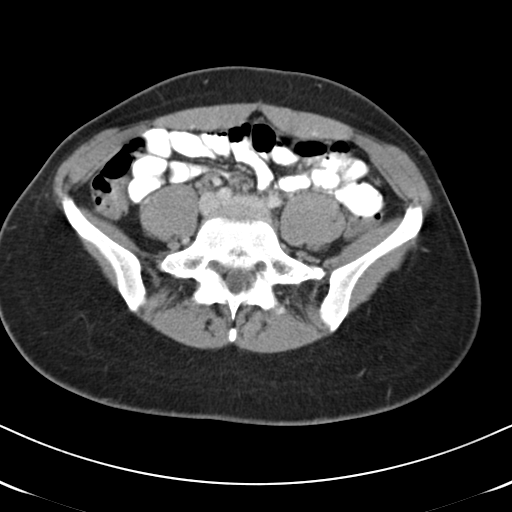
[im 43/85  soft-tissue]
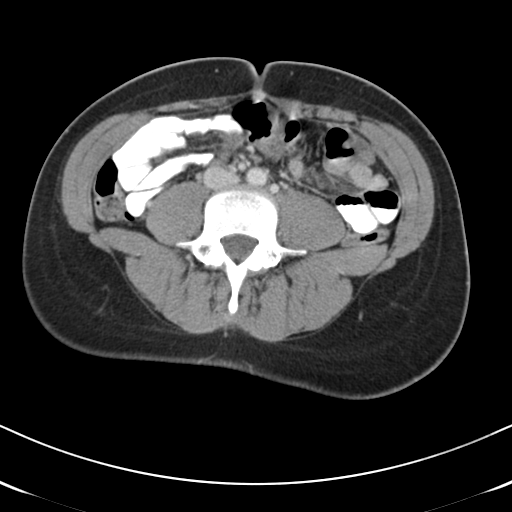
[im 50/85  soft-tissue]
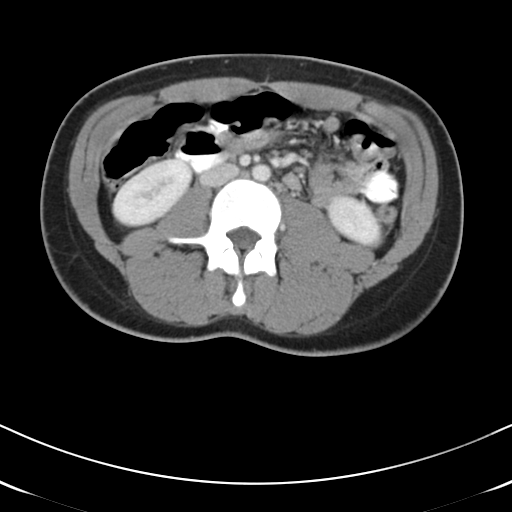
[im 57/85  soft-tissue]
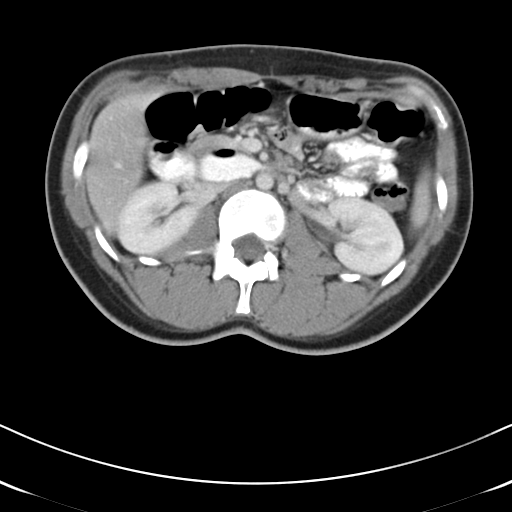
[im 57/85  bone]
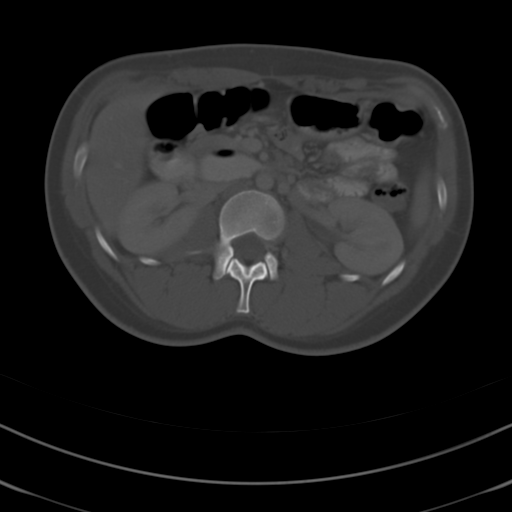
[im 60/85  soft-tissue]
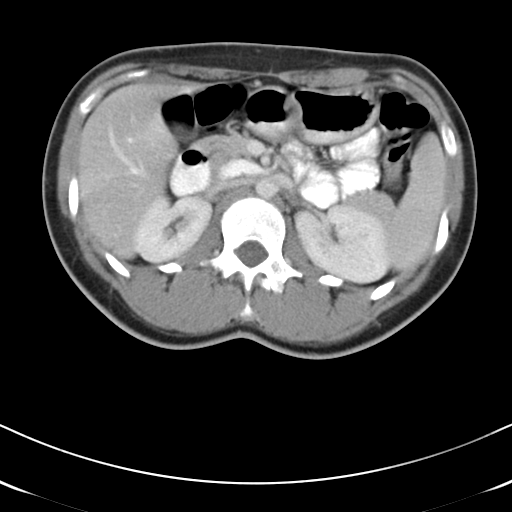
[im 67/85  soft-tissue]
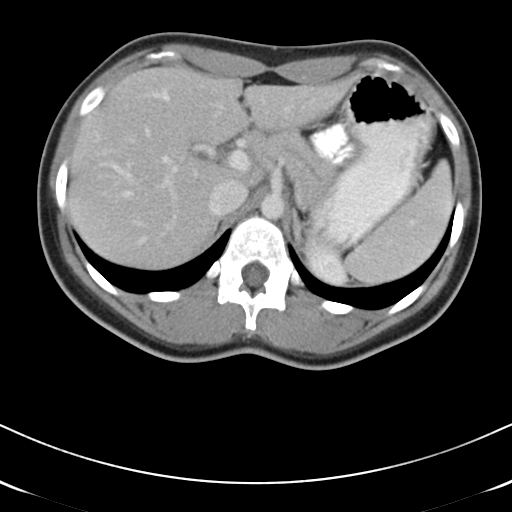
[im 71/85  lung]
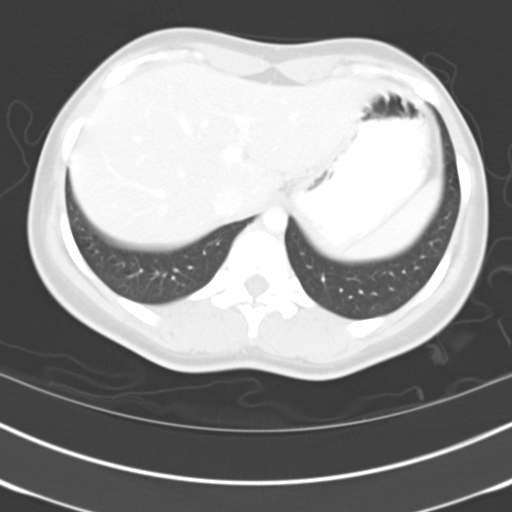
[im 74/85  soft-tissue]
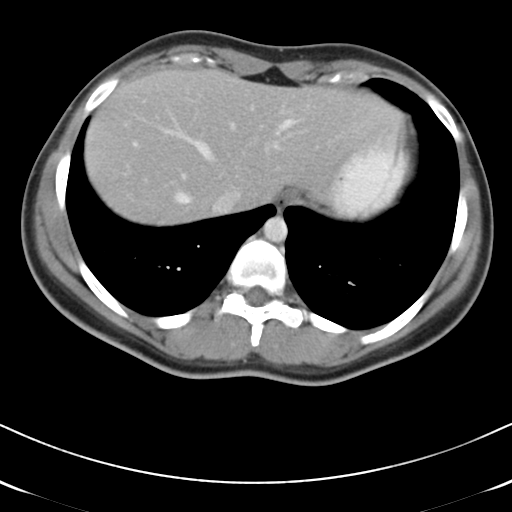
[im 74/85  lung]
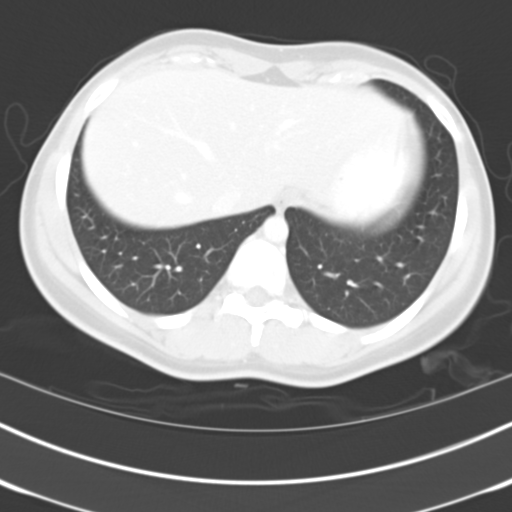
[im 78/85  lung]
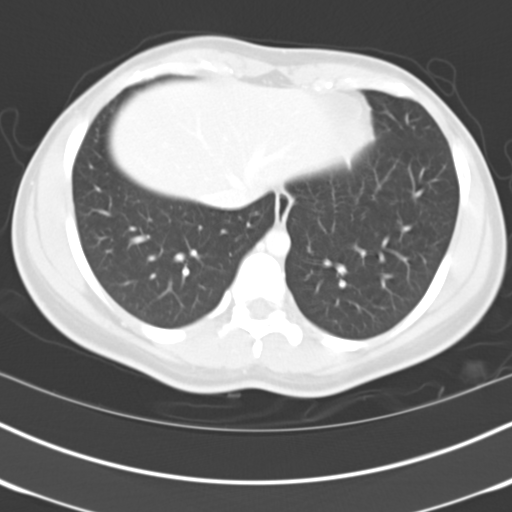
[im 81/85  soft-tissue]
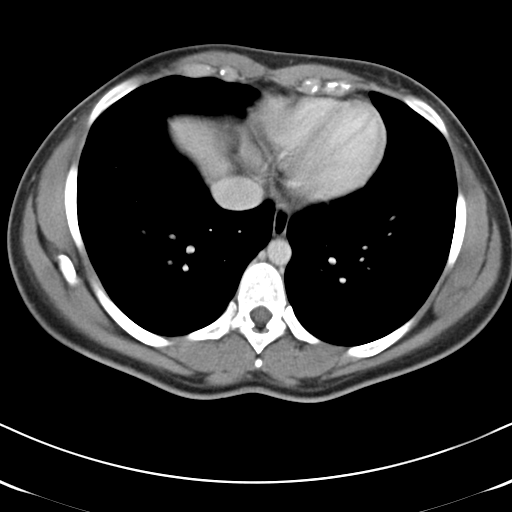
[im 81/85  lung]
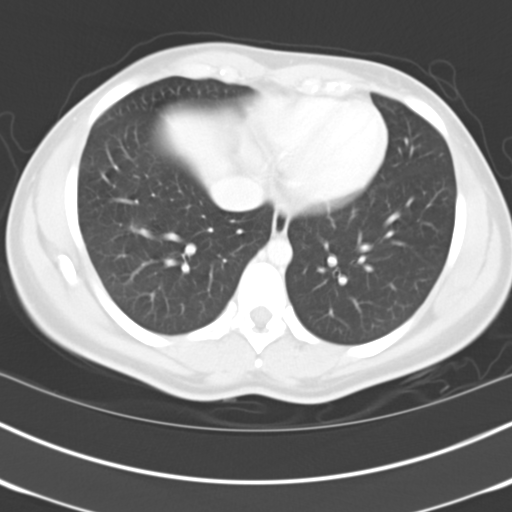

[15 of 32 positions shown; findings below may reference images not displayed]

FINDINGS: The visualized lung bases are clear. No intra-abdominal free air.
Trace free fluid may be present within the pelvis.

The liver, gallbladder, pancreas, spleen, adrenal glands, kidneys,
visualized ureters appear unremarkable. There is apparent diffuse
thickening of the bladder wall which may be partly related to
underdistention. Cystitis is not excluded. Correlation with
urinalysis recommended. The uterus and ovaries are grossly
unremarkable a 1.4 cm dominant right ovarian follicle. There is mild
haziness of the uterus and the ovaries. No discrete fluid collection
identified. Ultrasound is recommended for better evaluation of the
pelvic structures.

There is no evidence of bowel obstruction or inflammation. Normal
appendix.

The abdominal aorta and IVC appear patent. No portal venous gas
identified. There is no adenopathy. The abdominal wall soft tissues
appear unremarkable. The osseous structures are intact.
IMPRESSION: No definite acute intra-abdominal pelvic pathology.

Mild haziness of the uterus and ovaries. Correlation with clinical
exam and pelvic ultrasound recommended. No drainable fluid
collection or abscess.
